# Patient Record
Sex: Female | Born: 1990 | Race: Black or African American | Hispanic: No | Marital: Single | Smoking: Never smoker
Health system: Southern US, Community
[De-identification: ages and names within clinical notes are randomized; demographics above are authoritative.]

## PROBLEM LIST (undated history)

## (undated) ENCOUNTER — Inpatient Hospital Stay (HOSPITAL_COMMUNITY): Payer: Self-pay

## (undated) HISTORY — PX: TOOTH EXTRACTION: SUR596

---
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
## 2012-11-10 IMAGING — CT CT HEAD W/O CM
4 series · 17 of 30 positions shown, 19 images · non-contrast
Comparison: None.

CLINICAL DATA: EtOH, lethargy

CT HEAD WITHOUT CONTRAST
TECHNIQUE: Contiguous axial images were obtained from the base of
the skull through the vertex without contrast.

[Series 2: head w/o · axial · non-contrast · 0.43mm/px · z∈[-145,-45]mm · 6 of 29 slices shown, 8 images (1 of 2)]
[im 5/29  brain]
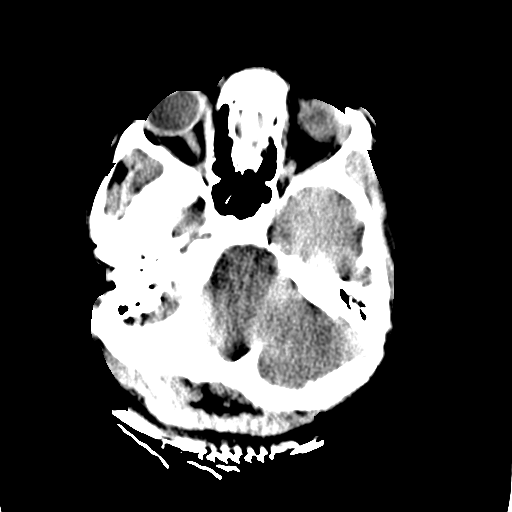
[im 5/29  bone]
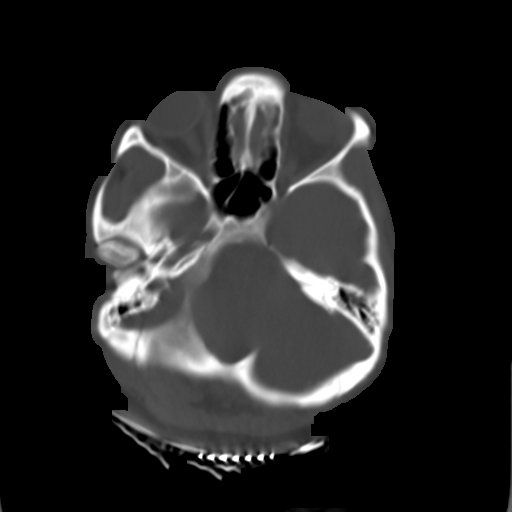
[im 9/29  brain]
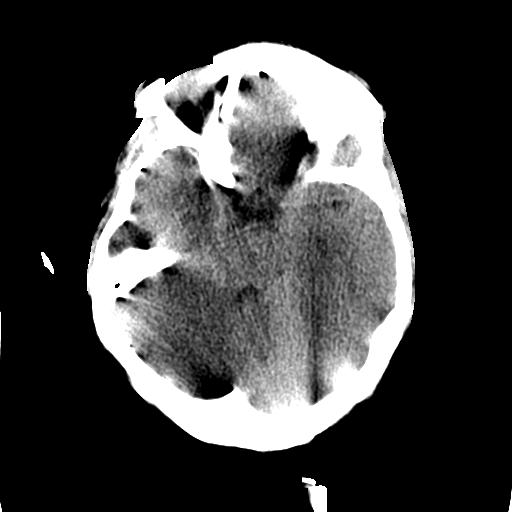
[im 13/29  brain]
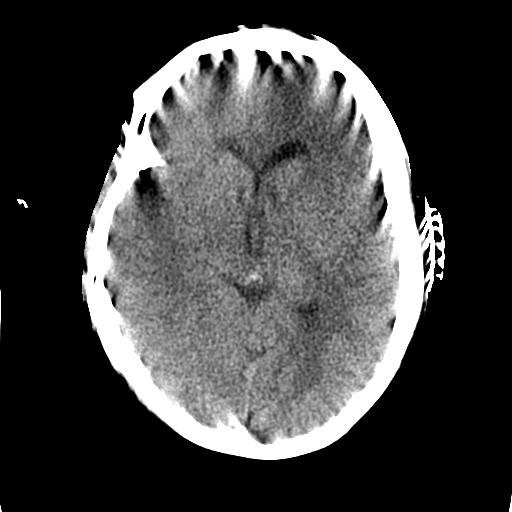
[im 17/29  brain]
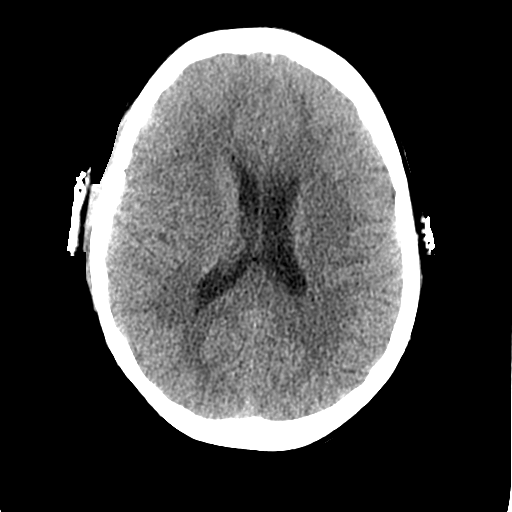
[im 21/29  brain]
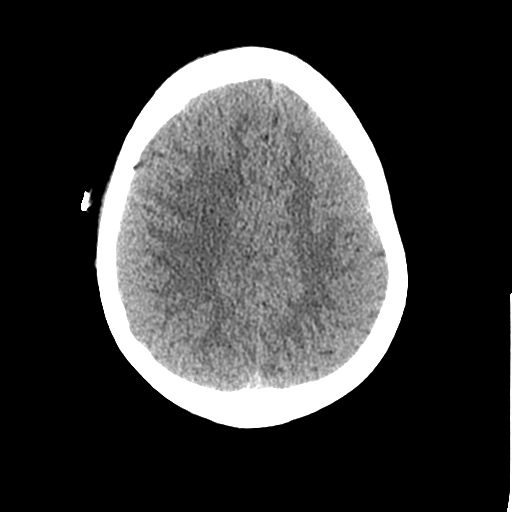
[im 21/29  bone]
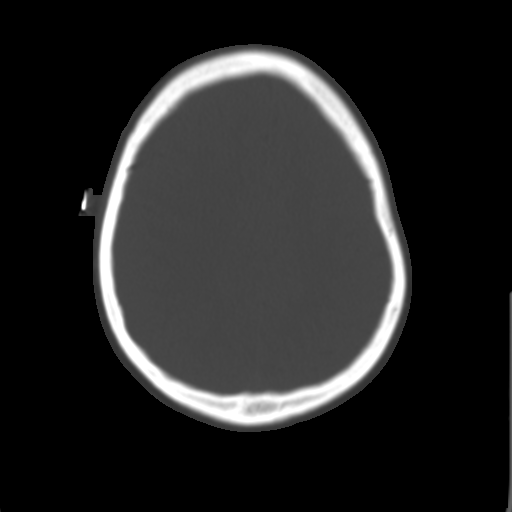
[im 25/29  brain]
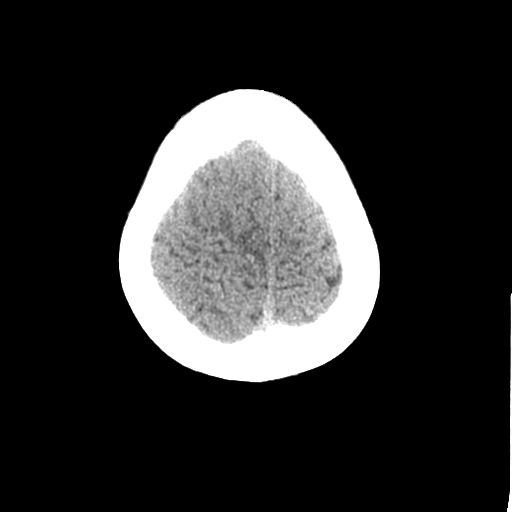

[Series 3: bone windows · axial · 0.43mm/px · z∈[-153,-39]mm · 8 of 48 slices shown (1 of 2)]
[im 5/48  bone]
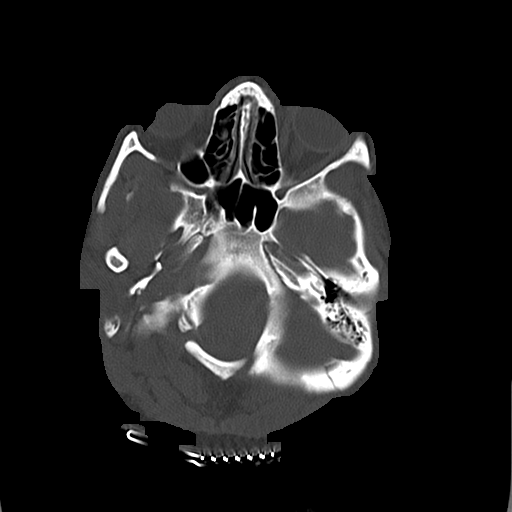
[im 9/48  bone]
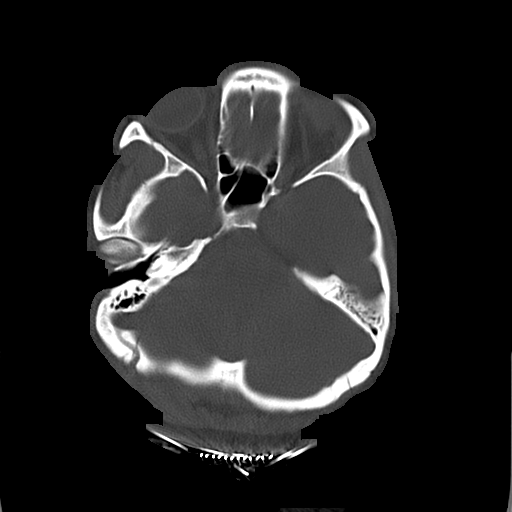
[im 18/48  bone]
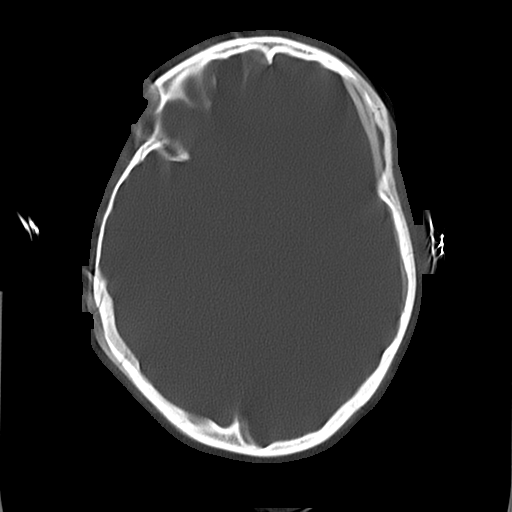
[im 22/48  bone]
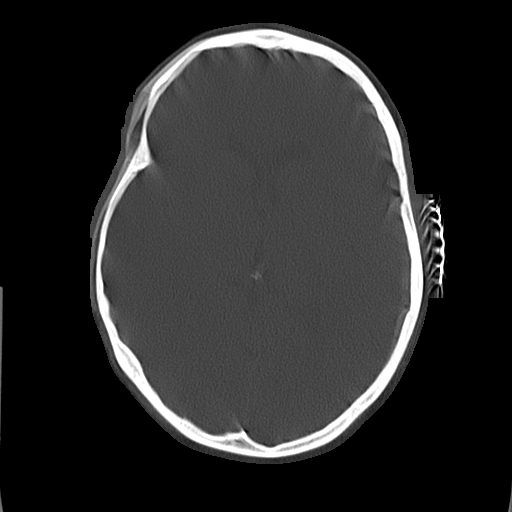
[im 26/48  bone]
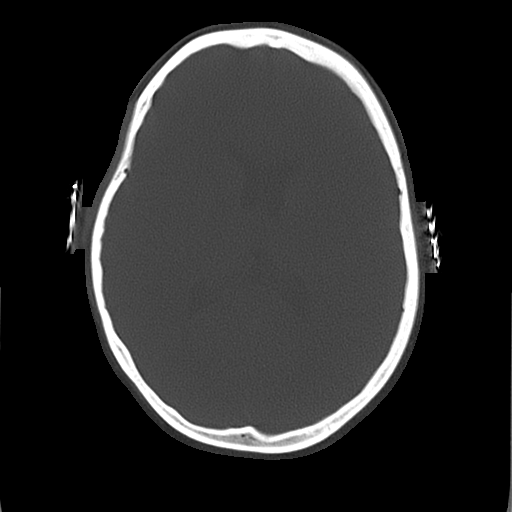
[im 30/48  bone]
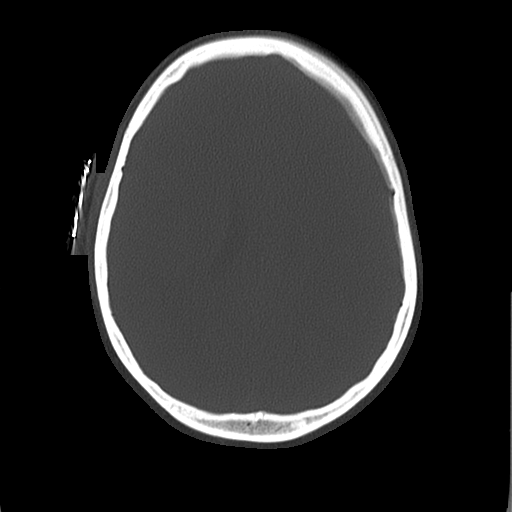
[im 39/48  bone]
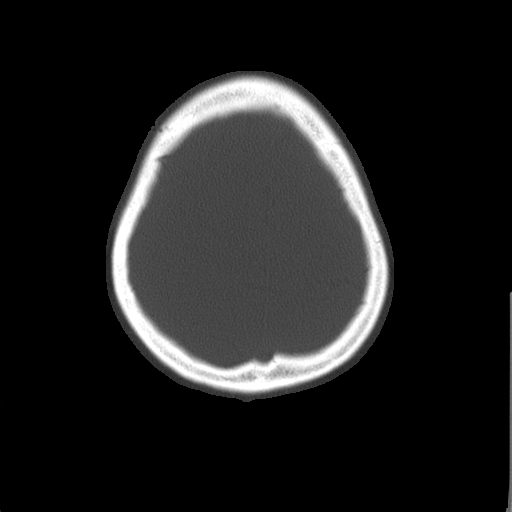
[im 43/48  bone]
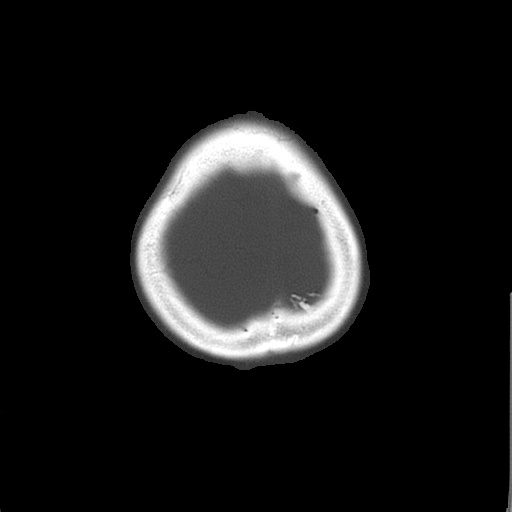

[Series 4: head w/o · axial · non-contrast · 0.43mm/px · z∈[-145,-120]mm · 2 of 15 slices shown (2 of 2)]
[im 5/15  brain]
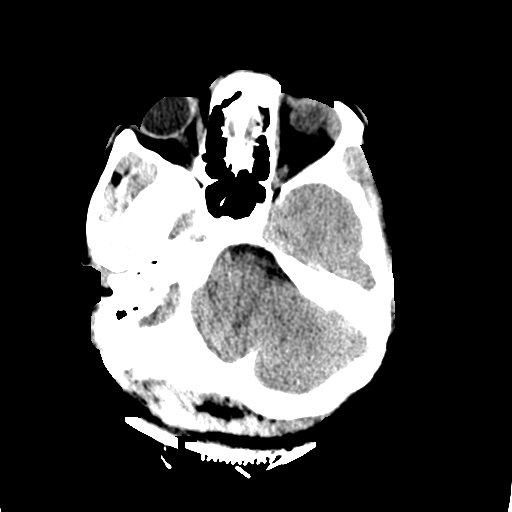
[im 10/15  brain]
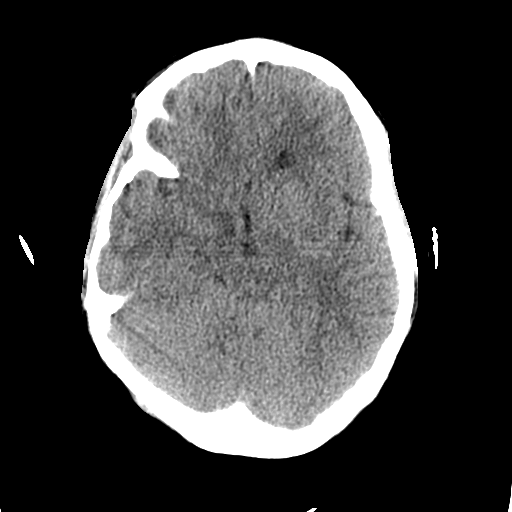

[Series 5: bone windows · axial · 0.43mm/px · 1 of 24 slices shown (2 of 2)]
[im 5/24  bone]
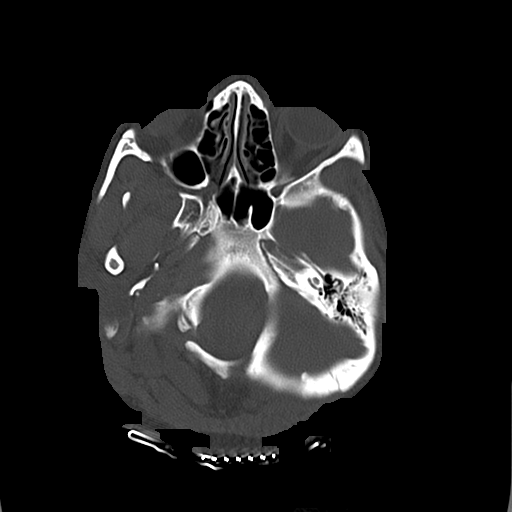

[17 of 30 positions shown; findings below may reference images not displayed]

FINDINGS: Degraded by patient motion and metallic hair clips within
the limitations, There is no evidence for large acute hemorrhage,
hydrocephalus, mass lesion, or large abnormal extra-axial fluid
collection.  No definite CT evidence for acute infarction.  The
visualized paranasal sinuses and mastoid air cells are
predominately clear.  No displaced calvarial fracture identified.
IMPRESSION: Degraded by motion and metallic hair clips.  No definite acute
process identified.  If clinical concern persists, consider
repeating when the patient can tolerate.

## 2016-12-29 ENCOUNTER — Ambulatory Visit (INDEPENDENT_AMBULATORY_CARE_PROVIDER_SITE_OTHER): Payer: No Typology Code available for payment source

## 2016-12-29 ENCOUNTER — Ambulatory Visit (HOSPITAL_COMMUNITY)
Admission: EM | Admit: 2016-12-29 | Discharge: 2016-12-29 | Disposition: A | Discharge status: Home / Self Care | Payer: No Typology Code available for payment source | Attending: Internal Medicine | Admitting: Internal Medicine

## 2016-12-29 ENCOUNTER — Encounter (HOSPITAL_COMMUNITY): Payer: Self-pay | Admitting: Emergency Medicine

## 2016-12-29 DIAGNOSIS — M791 Myalgia: Secondary | ICD-10-CM

## 2016-12-29 DIAGNOSIS — M7918 Myalgia, other site: Secondary | ICD-10-CM

## 2016-12-29 MED ORDER — IBUPROFEN 800 MG PO TABS
800.0000 mg | ORAL_TABLET | Freq: Once | ORAL | Status: AC
  Administered 2016-12-29: 800 mg via ORAL

## 2016-12-29 MED ORDER — IBUPROFEN 800 MG PO TABS
ORAL_TABLET | ORAL | Status: AC
  Filled 2016-12-29: qty 1

## 2016-12-29 MED ORDER — NAPROXEN 500 MG PO TABS: 500.0000 mg | ORAL_TABLET | Freq: Two times a day (BID) | ORAL | 0 refills | Status: DC

## 2016-12-29 NOTE — Discharge Instructions (Addendum)
Anticipate gradual improvement in back discomfort over the next week or two.  Ice for 5-10 minutes several times daily may help reduce pain.  Physical therapy and gentle stretching may also help reduce pain.  Xrays of the back in the urgent care today were negative for acute bony injury/fracture.  Prescription for naproxen (anti inflammatory, pain reliever) was given.  Recheck for further evaluation if not improving as expected.
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
## 2016-12-29 NOTE — ED Notes (Signed)
Instructed to put on gown for xray 
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
## 2017-07-25 ENCOUNTER — Inpatient Hospital Stay (HOSPITAL_COMMUNITY)
Admission: AD | Admit: 2017-07-25 | Discharge: 2017-07-25 | Disposition: A | Discharge status: Home / Self Care | Payer: BC Managed Care – PPO | Source: Ambulatory Visit | Attending: Family Medicine | Admitting: Family Medicine

## 2017-07-25 ENCOUNTER — Encounter (HOSPITAL_COMMUNITY): Payer: Self-pay

## 2017-07-25 DIAGNOSIS — O211 Hyperemesis gravidarum with metabolic disturbance: Secondary | ICD-10-CM | POA: Insufficient documentation

## 2017-07-25 DIAGNOSIS — O219 Vomiting of pregnancy, unspecified: Secondary | ICD-10-CM | POA: Diagnosis not present

## 2017-07-25 DIAGNOSIS — Z3A10 10 weeks gestation of pregnancy: Secondary | ICD-10-CM | POA: Diagnosis not present

## 2017-07-25 DIAGNOSIS — O99281 Endocrine, nutritional and metabolic diseases complicating pregnancy, first trimester: Secondary | ICD-10-CM | POA: Diagnosis not present

## 2017-07-25 DIAGNOSIS — E860 Dehydration: Secondary | ICD-10-CM | POA: Diagnosis not present

## 2017-07-25 LAB — CBC WITH DIFFERENTIAL/PLATELET
BASOS PCT: 0 %
Basophils Absolute: 0 10*3/uL (ref 0.0–0.1)
EOS ABS: 0 10*3/uL (ref 0.0–0.7)
EOS PCT: 0 %
HCT: 40.6 % (ref 36.0–46.0)
Hemoglobin: 14.4 g/dL (ref 12.0–15.0)
Lymphocytes Relative: 17 %
Lymphs Abs: 1.5 10*3/uL (ref 0.7–4.0)
MCH: 30.3 pg (ref 26.0–34.0)
MCHC: 35.5 g/dL (ref 30.0–36.0)
MCV: 85.3 fL (ref 78.0–100.0)
MONOS PCT: 4 %
Monocytes Absolute: 0.4 10*3/uL (ref 0.1–1.0)
NEUTROS PCT: 79 %
Neutro Abs: 7.1 10*3/uL (ref 1.7–7.7)
PLATELETS: 180 10*3/uL (ref 150–400)
RBC: 4.76 MIL/uL (ref 3.87–5.11)
RDW: 12.9 % (ref 11.5–15.5)
WBC: 9 10*3/uL (ref 4.0–10.5)

## 2017-07-25 LAB — URINALYSIS, ROUTINE W REFLEX MICROSCOPIC
BILIRUBIN URINE: NEGATIVE
Bacteria, UA: NONE SEEN
GLUCOSE, UA: NEGATIVE mg/dL
Hgb urine dipstick: NEGATIVE
KETONES UR: 80 mg/dL — AB
LEUKOCYTES UA: NEGATIVE
Nitrite: NEGATIVE
Protein, ur: 100 mg/dL — AB
Specific Gravity, Urine: 1.032 — ABNORMAL HIGH (ref 1.005–1.030)
pH: 6 (ref 5.0–8.0)

## 2017-07-25 LAB — COMPREHENSIVE METABOLIC PANEL
ALBUMIN: 4 g/dL (ref 3.5–5.0)
ALT: 14 U/L (ref 14–54)
ANION GAP: 10 (ref 5–15)
AST: 20 U/L (ref 15–41)
Alkaline Phosphatase: 82 U/L (ref 38–126)
BUN: 10 mg/dL (ref 6–20)
CO2: 23 mmol/L (ref 22–32)
Calcium: 9.9 mg/dL (ref 8.9–10.3)
Chloride: 102 mmol/L (ref 101–111)
Creatinine, Ser: 0.66 mg/dL (ref 0.44–1.00)
GFR calc non Af Amer: >60 mL/min (ref >60)
GLUCOSE: 88 mg/dL (ref 65–99)
POTASSIUM: 4.1 mmol/L (ref 3.5–5.1)
Sodium: 135 mmol/L (ref 135–145)
Total Bilirubin: 0.9 mg/dL (ref 0.3–1.2)
Total Protein: 7.6 g/dL (ref 6.5–8.1)

## 2017-07-25 LAB — POCT PREGNANCY, URINE: Preg Test, Ur: POSITIVE — AB

## 2017-07-25 MED ORDER — PROMETHAZINE HCL 25 MG PO TABS: 25.0000 mg | ORAL_TABLET | Freq: Four times a day (QID) | ORAL | 0 refills | Status: DC | PRN

## 2017-07-25 MED ORDER — SODIUM CHLORIDE 0.9 % IV SOLN
Freq: Once | INTRAVENOUS | Status: AC
  Administered 2017-07-25: 22:00:00 via INTRAVENOUS
  Filled 2017-07-25: qty 1000

## 2017-07-25 MED ORDER — LACTATED RINGERS IV SOLN
25.0000 mg | Freq: Once | INTRAVENOUS | Status: AC
  Administered 2017-07-25: 25 mg via INTRAVENOUS
  Filled 2017-07-25: qty 1

## 2017-07-25 NOTE — MAU Provider Note (Signed)
History     CSN: [id number]  Arrival date and time: 07/25/17 [id number]   First Provider Initiated Contact with Patient 07/25/17 1958      Chief Complaint  Patient presents with  . Nausea  . Emesis   HPI  Claudia Mccullough is a 27 y.o. G2P1001 at 854w1d who presents to MAU today with complaint of N/V for the last few days. She states she is unable to keep anything down. She denies lower abdominal pain, vaginal bleeding, diarrhea, fever or sick contacts. She was told to try vitamin B6 for nausea, but hasn't been able to find any at the pharmacy.   OB History    Gravida Para Term Preterm AB Living   2 1 1     1    SAB TAB Ectopic Multiple Live Births           1      History reviewed. No pertinent past medical history.  Past Surgical History:  Procedure Laterality Date  . TOOTH EXTRACTION      Family History  Problem Relation Age of Onset  . Hypertension Father     Social History   Tobacco Use  . Smoking status: Never Smoker  . Smokeless tobacco: Never Used  Substance Use Topics  . Alcohol use: No    Frequency: Never    Comment: binge drinking/ not currently   . Drug use: Yes    Types: Marijuana    Comment: last use 07/22/2017    Allergies: No Known Allergies  Medications Prior to Admission  Medication Sig Dispense Refill Last Dose  . naproxen (NAPROSYN) 500 MG tablet Take 1 tablet (500 mg total) by mouth 2 (two) times daily. 30 tablet 0     Review of Systems  Constitutional: Negative for fever.  Gastrointestinal: Positive for nausea and vomiting. Negative for abdominal pain, constipation and diarrhea.  Genitourinary: Negative for dysuria, frequency, urgency, vaginal bleeding and vaginal discharge.   Physical Exam   Blood pressure (!) 134/97, pulse (!) 120, temperature 98.4 F (36.9 C), temperature source Oral, resp. rate 18, height 5\' 3"  (1.6 m), weight 144 lb (65.3 kg), SpO2 99 %.  Physical Exam  Nursing note and vitals reviewed. Constitutional: She is  oriented to person, place, and time. She appears well-developed and well-nourished. No distress.  HENT:  Head: Normocephalic and atraumatic.  Cardiovascular: Normal rate.  Respiratory: Effort normal.  GI: Soft. She exhibits no distension.  Neurological: She is alert and oriented to person, place, and time.  Skin: Skin is warm and dry. No erythema.  Psychiatric: She has a normal mood and affect.    Results for orders placed or performed during the hospital encounter of 07/25/17 (from the past 24 hour(s))  Urinalysis, Routine w reflex microscopic     Status: Abnormal   Collection Time: 07/25/17  7:37 PM  Result Value Ref Range   Color, Urine YELLOW YELLOW   APPearance HAZY (A) CLEAR   Specific Gravity, Urine 1.032 (H) 1.005 - 1.030   pH 6.0 5.0 - 8.0   Glucose, UA NEGATIVE NEGATIVE mg/dL   Hgb urine dipstick NEGATIVE NEGATIVE   Bilirubin Urine NEGATIVE NEGATIVE   Ketones, ur 80 (A) NEGATIVE mg/dL   Protein, ur 119100 (A) NEGATIVE mg/dL   Nitrite NEGATIVE NEGATIVE   Leukocytes, UA NEGATIVE NEGATIVE   RBC / HPF 0-5 0 - 5 RBC/hpf   WBC, UA 0-5 0 - 5 WBC/hpf   Bacteria, UA NONE SEEN NONE SEEN   Squamous Epithelial /  LPF 0-5 (A) NONE SEEN   Mucus PRESENT   Pregnancy, urine POC     Status: Abnormal   Collection Time: 07/25/17  7:52 PM  Result Value Ref Range   Preg Test, Ur POSITIVE (A) NEGATIVE  CBC with Differential/Platelet     Status: None   Collection Time: 07/25/17  8:14 PM  Result Value Ref Range   WBC 9.0 4.0 - 10.5 K/uL   RBC 4.76 3.87 - 5.11 MIL/uL   Hemoglobin 14.4 12.0 - 15.0 g/dL   HCT 62.1 30.8 - 65.7 %   MCV 85.3 78.0 - 100.0 fL   MCH 30.3 26.0 - 34.0 pg   MCHC 35.5 30.0 - 36.0 g/dL   RDW 84.6 96.2 - 95.2 %   Platelets 180 150 - 400 K/uL   Neutrophils Relative % 79 %   Neutro Abs 7.1 1.7 - 7.7 K/uL   Lymphocytes Relative 17 %   Lymphs Abs 1.5 0.7 - 4.0 K/uL   Monocytes Relative 4 %   Monocytes Absolute 0.4 0.1 - 1.0 K/uL   Eosinophils Relative 0 %    Eosinophils Absolute 0.0 0.0 - 0.7 K/uL   Basophils Relative 0 %   Basophils Absolute 0.0 0.0 - 0.1 K/uL  Comprehensive metabolic panel     Status: None   Collection Time: 07/25/17  8:14 PM  Result Value Ref Range   Sodium 135 135 - 145 mmol/L   Potassium 4.1 3.5 - 5.1 mmol/L   Chloride 102 101 - 111 mmol/L   CO2 23 22 - 32 mmol/L   Glucose, Bld 88 65 - 99 mg/dL   BUN 10 6 - 20 mg/dL   Creatinine, Ser 8.41 0.44 - 1.00 mg/dL   Calcium 9.9 8.9 - 32.4 mg/dL   Total Protein 7.6 6.5 - 8.1 g/dL   Albumin 4.0 3.5 - 5.0 g/dL   AST 20 15 - 41 U/L   ALT 14 14 - 54 U/L   Alkaline Phosphatase 82 38 - 126 U/L   Total Bilirubin 0.9 0.3 - 1.2 mg/dL   GFR calc non Af Amer >60 >60 mL/min   GFR calc Af Amer >60 >60 mL/min   Anion gap 10 5 - 15    MAU Course  Procedures None  MDM +UPT  FHR - 169 bpm with doppler  UA today with evidence of dehydration  IV LR with phenergan infusion ordered  IV LR with MV ordered 2100 - Patient receiving IV fluids and anti-emetics. Care turned over to [facility], CNM   Vonzella Nipple, PA-C 07/25/2017, 9:00 PM   Tolerating po. No emesis. Stable for discharge.  Assessment and Plan   1. [redacted] weeks gestation of pregnancy   2. Nausea/vomiting in pregnancy   3. Dehydration    Discharge home Follow up with [facility] family med as scheduled Rx Phenergan HEG diet Maintain hydration  Allergies as of 07/25/2017   No Known Allergies     Medication List    STOP taking these medications   naproxen 500 MG tablet Commonly known as:  NAPROSYN     TAKE these medications   promethazine 25 MG tablet Commonly known as:  PHENERGAN Take 1 tablet (25 mg total) by mouth every 6 (six) hours as needed for nausea or vomiting.      Donette Larry, CNM  07/25/2017 11:16 PM

## 2017-07-25 NOTE — MAU Note (Signed)
Pt here with c/o nausea and vomiting. "Can't keep anything down." 

## 2017-07-25 NOTE — Discharge Instructions (Signed)
Eating Plan for Hyperemesis Gravidarum °Hyperemesis gravidarum is a severe form of morning sickness. Because this condition causes severe nausea and vomiting, it can lead to dehydration, malnutrition, and weight loss. One way to lessen the symptoms of nausea and vomiting is to follow the eating plan for hyperemesis gravidarum. It is often used along with prescribed medicines to control your symptoms. °What can I do to relieve my symptoms? °Listen to your body. Everyone is different and has different preferences. Find what works best for you. Take any of the following actions that are helpful to you: °· Eat and drink slowly. °· Eat 5-6 small meals daily instead of 3 large meals. °· Eat crackers before you get out of bed in the morning. °· Try having a snack in the middle of the night. °· Starchy foods are usually tolerated well. Examples include cereal, toast, bread, potatoes, pasta, rice, and pretzels. °· Ginger may help with nausea. Add ¼ tsp ground ginger to hot tea or choose ginger tea. °· Try drinking 100% fruit juice or an electrolyte drink. An electrolyte drink contains sodium, potassium, and chloride. °· Continue to take your prenatal vitamins as told by your health care provider. If you are having trouble taking your prenatal vitamins, talk with your health care provider about different options. °· Include at least 1 serving of protein with your meals and snacks. Protein options include meats or poultry, beans, nuts, eggs, and yogurt. Try eating a protein-rich snack before bed. Examples of these snacks include cheese and crackers or half of a peanut butter or turkey sandwich. °· Consider eliminating foods that trigger your symptoms. These may include spicy foods, coffee, high-fat foods, very sweet foods, and acidic foods. °· Try meals that have more protein combined with bland, salty, lower-fat, and dry foods, such as nuts, seeds, pretzels, crackers, and cereal. °· Talk with your healthcare provider about  starting a supplement of vitamin B6. °· Have fluids that are cold, clear, and carbonated or sour. Examples include lemonade, ginger ale, lemon-lime soda, ice water, and sparkling water. °· Try lemon or mint tea. °· Try brushing your teeth or using a mouth rinse after meals. ° °What should I avoid to reduce my symptoms? °Avoiding some of the following things may help reduce your symptoms. °· Foods with strong smells. Try eating meals in well-ventilated areas that are free of odors. °· Drinking water or other beverages with meals. Try not to drink anything during the 30 minutes before and after your meals. °· Drinking more than 1 cup of fluid at a time. Sometimes using a straw helps. °· Fried or high-fat foods, such as butter and cream sauces. °· Spicy foods. °· Skipping meals as best as you can. Nausea can be more intense on an empty stomach. If you cannot tolerate food at that time, do not force it. Try sucking on ice chips or other frozen items, and make up for missed calories later. °· Lying down within 2 hours after eating. °· Environmental triggers. These may include smoky rooms, closed spaces, rooms with strong smells, warm or humid places, overly loud and noisy rooms, and rooms with motion or flickering lights. °· Quick and sudden changes in your movement. ° °This information is not intended to replace advice given to you by your health care provider. Make sure you discuss any questions you have with your health care provider. °Document Released: 05/08/2007 Document Revised: 03/09/2016 Document Reviewed: 02/09/2016 °Elsevier Interactive Patient Education © 2018 [employer] ° ° °Hyperemesis Gravidarum °  Hyperemesis gravidarum is a severe form of nausea and vomiting that happens during pregnancy. Hyperemesis is worse than morning sickness. It may cause you to have nausea or vomiting all day for many days. It may keep you from eating and drinking enough food and liquids. Hyperemesis usually occurs during the  first half (the first 20 weeks) of pregnancy. It often goes away once a woman is in her second half of pregnancy. However, sometimes hyperemesis continues through an entire pregnancy. °What are the causes? °The cause of this condition is not known. It may be related to changes in chemicals (hormones) in the body during pregnancy, such as the high level of pregnancy hormone (human chorionic gonadotropin) or the increase in the female sex hormone (estrogen). °What are the signs or symptoms? °Symptoms of this condition include: °· Severe nausea and vomiting. °· Nausea that does not go away. °· Vomiting that does not allow you to keep any food down. °· Weight loss. °· Body fluid loss (dehydration). °· Having no desire to eat, or not liking food that you have previously enjoyed. ° °How is this diagnosed? °This condition may be diagnosed based on: °· A physical exam. °· Your medical history. °· Your symptoms. °· Blood tests. °· Urine tests. ° °How is this treated? °This condition may be managed with medicine. If medicines to do not help relieve nausea and vomiting, you may need to receive fluids through an IV tube at the hospital. °Follow these instructions at home: °· Take over-the-counter and prescription medicines only as told by your health care provider. °· Avoid iron pills and multivitamins that contain iron for the first 3-4 months of pregnancy. If you take prescription iron pills, do not stop taking them unless your health care provider approves. °· Take the following actions to help prevent nausea and vomiting: °? In the morning, before getting out of bed, try eating a couple of dry crackers or a piece of toast. °? Avoid foods and smells that upset your stomach. Fatty and spicy foods may make nausea worse. °? Eat 5-6 small meals a day. °? Do not drink fluids while eating meals. Drink between meals. °? Eat or suck on things that have ginger in them. Ginger can help relieve nausea. °? Avoid food preparation. The  smell of food can spoil your appetite or trigger nausea. °· Follow instructions from your health care provider about eating or drinking restrictions. °· For snacks, eat high-protein foods, such as cheese. °· Keep all follow-up and pre-birth (prenatal) visits as told by your health care provider. This is important. °Contact a health care provider if: °· You have pain in your abdomen. °· You have a severe headache. °· You have vision problems. °· You are losing weight. °Get help right away if: °· You cannot drink fluids without vomiting. °· You vomit blood. °· You have constant nausea and vomiting. °· You are very weak. °· You are very thirsty. °· You feel dizzy. °· You faint. °· You have a fever or other symptoms that last for more than 2-3 days. °· You have a fever and your symptoms suddenly get worse. °Summary °· Hyperemesis gravidarum is a severe form of nausea and vomiting that happens during pregnancy. °· Making some changes to your eating habits may help relieve nausea and vomiting. °· This condition may be managed with medicine. °· If medicines to do not help relieve nausea and vomiting, you may need to receive fluids through an IV tube at the hospital. °This information   is not intended to replace advice given to you by your health care provider. Make sure you discuss any questions you have with your health care provider. °Document Released: 07/11/2005 Document Revised: 03/09/2016 Document Reviewed: 03/09/2016 °Elsevier Interactive Patient Education © 2017 [employer]. ° °
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
## 2018-05-14 ENCOUNTER — Encounter (HOSPITAL_COMMUNITY): Payer: Self-pay

## 2020-01-06 ENCOUNTER — Ambulatory Visit
Admission: RE | Admit: 2020-01-06 | Discharge: 2020-01-06 | Disposition: A | Discharge status: Home / Self Care | Payer: 59 | Source: Ambulatory Visit | Attending: Emergency Medicine | Admitting: Emergency Medicine

## 2020-01-06 ENCOUNTER — Other Ambulatory Visit: Payer: Self-pay

## 2020-01-06 VITALS — BP 126/76 | HR 81 | Temp 98.7°F | Resp 16

## 2020-01-06 DIAGNOSIS — Z20822 Contact with and (suspected) exposure to covid-19: Secondary | ICD-10-CM | POA: Diagnosis not present

## 2020-01-06 NOTE — ED Provider Notes (Signed)
[facility]    CSN: [id number] Arrival date & time: 01/06/20  1056      History   Chief Complaint Chief Complaint  Patient presents with  . Covid Exposure    HPI Claudia Mccullough is a 29 y.o. female without significant medical history presenting for Covid testing.  States her son was sent home from daycare due to reported fever.  Patient denies symptoms at this time including fever, cough, shortness of breath.   History reviewed. No pertinent past medical history.  There are no problems to display for this patient.   Past Surgical History:  Procedure Laterality Date  . TOOTH EXTRACTION      OB History    Gravida  2   Para  1   Term  1   Preterm      AB      Living  1     SAB      TAB      Ectopic      Multiple      Live Births  1            Home Medications    Prior to Admission medications   Not on File    Family History Family History  Problem Relation Age of Onset  . Hypertension Father     Social History Social History   Tobacco Use  . Smoking status: Never Smoker  . Smokeless tobacco: Never Used  Substance Use Topics  . Alcohol use: No    Comment: binge drinking/ not currently   . Drug use: Yes    Types: Marijuana    Comment: last use 07/22/2017     Allergies   Patient has no known allergies.   Review of Systems As per HPI   Physical Exam Triage Vital Signs ED Triage Vitals [01/06/20 1112]  Enc Vitals Group     BP 126/76     Pulse Rate 81     Resp 16     Temp 98.7 F (37.1 C)     Temp src      SpO2 98 %     Weight      Height      Head Circumference      Peak Flow      Pain Score      Pain Loc      Pain Edu?      Excl. in GC?    No data found.  Updated Vital Signs BP 126/76   Pulse 81   Temp 98.7 F (37.1 C)   Resp 16   LMP  (LMP Unknown)   SpO2 98%   Visual Acuity Right Eye Distance:   Left Eye Distance:   Bilateral Distance:    Right Eye Near:   Left Eye Near:     Bilateral Near:     Physical Exam Constitutional:      General: She is not in acute distress. HENT:     Head: Normocephalic and atraumatic.  Eyes:     General: No scleral icterus.    Pupils: Pupils are equal, round, and reactive to light.  Cardiovascular:     Rate and Rhythm: Normal rate.  Pulmonary:     Effort: Pulmonary effort is normal.  Skin:    Coloration: Skin is not jaundiced or pale.  Neurological:     Mental Status: She is alert and oriented to person, place, and time.      UC Treatments / Results  Labs (  all labs ordered are listed, but only abnormal results are displayed) Labs Reviewed  NOVEL CORONAVIRUS, NAA    EKG   Radiology No results found.  Procedures Procedures (including critical care time)  Medications Ordered in UC Medications - No data to display  Initial Impression / Assessment and Plan / UC Course  I have reviewed the triage vital signs and the nursing notes.  Pertinent labs & imaging results that were available during my care of the patient were reviewed by me and considered in my medical decision making (see chart for details).     Patient afebrile, nontoxic, with SpO2 98%.  Covid PCR pending.  Patient to quarantine until results are back.  We will treat supportively as outlined below.  Return precautions discussed, patient verbalized understanding and is agreeable to plan. Final Clinical Impressions(s) / UC Diagnoses   Final diagnoses:  Contact with and (suspected) exposure to covid-19     Discharge Instructions     Your COVID test is pending - it is important to quarantine / isolate at home until your results are back. If you test positive and would like further evaluation for persistent or worsening symptoms, you may schedule an E-visit or virtual (video) visit throughout the [facility] app or website.  PLEASE NOTE: If you develop severe chest pain or shortness of breath please go to the ER or call 9-1-1 for further  evaluation --> DO NOT schedule electronic or virtual visits for this. Please call our office for further guidance / recommendations as needed.  For information about the Covid vaccine, please visit [email or web address]    ED Prescriptions    None     PDMP not reviewed this encounter.   [place], [place], [place] 01/06/20 1323

## 2020-01-06 NOTE — ED Triage Notes (Signed)
Patient requesting COVID testing after son sent home from daycare for fever

## 2020-01-06 NOTE — Discharge Instructions (Signed)
Your COVID test is pending - it is important to quarantine / isolate at home until your results are back. °If you test positive and would like further evaluation for persistent or worsening symptoms, you may schedule an E-visit or virtual (video) visit throughout the [facility] app or website. ° °PLEASE NOTE: If you develop severe chest pain or shortness of breath please go to the ER or call 9-1-1 for further evaluation --> DO NOT schedule electronic or virtual visits for this. °Please call our office for further guidance / recommendations as needed. ° °For information about the Covid vaccine, please visit [email or web address] °

## 2020-01-07 LAB — NOVEL CORONAVIRUS, NAA: SARS-CoV-2, NAA: NOT DETECTED

## 2020-01-07 LAB — SARS-COV-2, NAA 2 DAY TAT

## 2020-11-11 ENCOUNTER — Emergency Department (HOSPITAL_COMMUNITY)
Admission: EM | Admit: 2020-11-11 | Discharge: 2020-11-11 | Disposition: A | Discharge status: Home / Self Care | Payer: BC Managed Care – PPO | Attending: Emergency Medicine | Admitting: Emergency Medicine

## 2020-11-11 ENCOUNTER — Encounter (HOSPITAL_COMMUNITY): Payer: Self-pay | Admitting: Emergency Medicine

## 2020-11-11 DIAGNOSIS — H9201 Otalgia, right ear: Secondary | ICD-10-CM

## 2020-11-11 DIAGNOSIS — R519 Headache, unspecified: Secondary | ICD-10-CM | POA: Insufficient documentation

## 2020-11-11 DIAGNOSIS — K0889 Other specified disorders of teeth and supporting structures: Secondary | ICD-10-CM

## 2020-11-11 MED ORDER — OXYCODONE-ACETAMINOPHEN 5-325 MG PO TABS
1.0000 | ORAL_TABLET | Freq: Once | ORAL | Status: AC
  Administered 2020-11-11: 1 via ORAL
  Filled 2020-11-11: qty 1

## 2020-11-11 MED ORDER — ACETAMINOPHEN 500 MG PO TABS
1000.0000 mg | ORAL_TABLET | Freq: Once | ORAL | Status: AC
  Administered 2020-11-11: 1000 mg via ORAL
  Filled 2020-11-11: qty 2

## 2020-11-11 MED ORDER — PENICILLIN V POTASSIUM 500 MG PO TABS: 500.0000 mg | ORAL_TABLET | Freq: Four times a day (QID) | ORAL | 0 refills | Status: AC

## 2020-11-11 MED ORDER — LIDOCAINE VISCOUS HCL 2 % MT SOLN
15.0000 mL | Freq: Once | OROMUCOSAL | Status: AC
  Administered 2020-11-11: 15 mL via OROMUCOSAL
  Filled 2020-11-11: qty 15

## 2020-11-11 NOTE — ED Triage Notes (Addendum)
Pt here from home with c/o right ear pain that started today pt has a bad tooth to the right side upper side

## 2020-11-11 NOTE — ED Triage Notes (Signed)
Emergency Medicine Provider Triage Evaluation Note  Azayla Polo , a 30 y.o. female  was evaluated in triage.  Pt complains of right ear pain, right-sided facial pain.  Review of Systems  Positive: Facial pain, ear pain, chest pain Negative: Fever  Physical Exam  BP 138/90   Pulse 73   Temp 98.1 F (36.7 C)   Resp 18   SpO2 100%  Gen:   Awake, no distress  HEENT:  Atraumatic, right face is exquisitely tender to palpation, there is some fullness behind the right ear, ear canal is mildly erythematous but TM is clear, there is a broken tooth over the right upper posterior molars without clear abscess Resp:  Normal effort  Cardiac:  Normal rate  Abd:   Nondistended MSK:   Moves extremities without difficulty  Neuro:  Speech clear   Medical Decision Making  Medically screening exam initiated at 1:37 PM.  Appropriate orders placed.  Samreet Edenfield was informed that the remainder of the evaluation will be completed by another provider, this initial triage assessment does not replace that evaluation, and the importance of remaining in the ED until their evaluation is complete.  Clinical Impression  1.  Right ear pain 2.  Facial pain   Dartha Lodge, [place] 11/11/20 1342

## 2020-11-11 NOTE — Discharge Instructions (Signed)
You came to the emergency department today to be evaluated for your jaw and ear pain.  Your pain is likely due to an infection in your cracked tooth.  Have started you on antibiotics.  Will be important to follow-up with a dentist.  I have given you information to follow-up with a dentist in the area.  If your symptoms do not improve please follow-up with your primary care provider.  Today you received medications that may make you sleepy or impair your ability to make decisions.  For the next 24 hours please do not drive, operate heavy machinery, care for a small child with out another adult present, or perform any activities that may cause harm to you or someone else if you were to fall asleep or be impaired.   You may have diarrhea from the antibiotics.  It is very important that you continue to take the antibiotics even if you get diarrhea unless a medical professional tells you that you may stop taking them.  If you stop too early the bacteria you are being treated for will become stronger and you may need different, more powerful antibiotics that have more side effects and worsening diarrhea.  Please stay well hydrated and consider probiotics as they may decrease the severity of your diarrhea.  Please be aware that if you take any hormonal contraception (birth control pills, nexplanon, the ring, etc) that your birth control will not work while you are taking antibiotics and you need to use back up protection as directed on the birth control medication information insert.    Get help right away if: You are unable to open your mouth. You are having trouble breathing or swallowing. You have a fever. You notice that your face, neck, or jaw is swollen.

## 2020-11-11 NOTE — ED Provider Notes (Signed)
[facility] EMERGENCY DEPARTMENT Provider Note   CSN: [id number] Arrival date & time: 11/11/20  1322     History No chief complaint on file.   Claudia Mccullough is a 30 y.o. female with no pertinent past medical history.  Patient presents with chief plaint of right ER and right-sided facial pain.  She reports that her pain began earlier this morning.  Patient reports that pain was sudden in onset and has progressively worsened over time.  Pain starts in her jaw and moves to her right ear and temple.  Patient rates her pain 10/10 on the pain scale.  Patient reports pain is worse with movement or touch.  She describes her pain as a throbbing sensation.  Patient reports no relief of symptoms with 1500 mg ibuprofen or Orajel.   Patient reports history of grinding her teeth at night.  Patient reports that she cracked her top right molar a "few," months ago.  Patient denies any fevers, chills, facial swelling, visual disturbance, neck pain, headache, slurred speech, facial asymmetry, chest pain, shortness of breath.    HPI     No past medical history on file.  There are no problems to display for this patient.   Past Surgical History:  Procedure Laterality Date  . TOOTH EXTRACTION       OB History    Gravida  2   Para  1   Term  1   Preterm      AB      Living  1     SAB      IAB      Ectopic      Multiple      Live Births  1           Family History  Problem Relation Age of Onset  . Hypertension Father     Social History   Tobacco Use  . Smoking status: Never Smoker  . Smokeless tobacco: Never Used  Substance Use Topics  . Alcohol use: No    Comment: binge drinking/ not currently   . Drug use: Yes    Types: Marijuana    Comment: last use 07/22/2017    Home Medications Prior to Admission medications   Not on File    Allergies    Patient has no known allergies.  Review of Systems   Review of Systems  Constitutional:  Negative for chills and fever.  HENT: Positive for dental problem and ear pain. Negative for congestion, ear discharge, facial swelling, hearing loss, rhinorrhea, sinus pressure, sinus pain, sore throat, trouble swallowing and voice change.   Eyes: Negative for visual disturbance.  Respiratory: Negative for shortness of breath.   Cardiovascular: Negative for chest pain.  Gastrointestinal: Negative for nausea and vomiting.  Musculoskeletal: Negative for neck pain and neck stiffness.  Skin: Negative for color change and wound.  Neurological: Negative for dizziness, tremors, seizures, syncope, facial asymmetry, speech difficulty, weakness, light-headedness, numbness and headaches.  Psychiatric/Behavioral: Negative for confusion.    Physical Exam Updated Vital Signs BP 138/90   Pulse 73   Temp 98.1 F (36.7 C)   Resp 18   SpO2 100%   Physical Exam Vitals and nursing note reviewed.  Constitutional:      General: She is not in acute distress.    Appearance: She is not ill-appearing, toxic-appearing or diaphoretic.  HENT:     Head: Normocephalic and atraumatic. No raccoon eyes, abrasion, masses, right periorbital erythema, left periorbital erythema or laceration.  Jaw: Tenderness (right upper and lower) present. No trismus, swelling, pain on movement or malocclusion.     Comments: Exquisite tenderness to right sided face    Right Ear: No decreased hearing noted. No laceration, drainage, swelling or tenderness. No middle ear effusion. There is no impacted cerumen. No foreign body. No mastoid tenderness. No hemotympanum. Tympanic membrane is not injected, scarred, perforated, erythematous, retracted or bulging.     Left Ear: No decreased hearing noted. No laceration, drainage, swelling or tenderness.  No middle ear effusion. There is no impacted cerumen. No foreign body. No mastoid tenderness. No hemotympanum. Tympanic membrane is not injected, scarred, perforated, erythematous, retracted or  bulging.     Ears:     Comments: Mild erythema to right ear canal, mild effusion behind right TM    Mouth/Throat:     Mouth: Mucous membranes are moist.     Dentition: Abnormal dentition. Dental tenderness present. No dental abscesses.     Pharynx: Oropharynx is clear. Uvula midline. No pharyngeal swelling, oropharyngeal exudate, posterior oropharyngeal erythema or uvula swelling.     Tonsils: No tonsillar exudate or tonsillar abscesses.      Comments: Patient has cracked right upper back molar, dental tenderness present  No dental abscess observed  Eyes:     General: No scleral icterus.       Right eye: No discharge.        Left eye: No discharge.  Neck:     Vascular: No carotid bruit.  Cardiovascular:     Rate and Rhythm: Normal rate.     Pulses:          Carotid pulses are 3+ on the right side and 3+ on the left side. Pulmonary:     Effort: Pulmonary effort is normal.  Musculoskeletal:     Cervical back: Full passive range of motion without pain, normal range of motion and neck supple. No edema, erythema, signs of trauma, rigidity, torticollis or crepitus. No pain with movement, spinous process tenderness or muscular tenderness. Normal range of motion.  Lymphadenopathy:     Cervical: No cervical adenopathy.  Skin:    General: Skin is warm and dry.  Neurological:     General: No focal deficit present.     Mental Status: She is alert.     Comments: CN II through XII intact  To move all limbs equally without any difficulty  Psychiatric:        Behavior: Behavior is cooperative.     ED Results / Procedures / Treatments   Labs (all labs ordered are listed, but only abnormal results are displayed) Labs Reviewed - No data to display  EKG None  Radiology No results found.  Procedures Procedures   Medications Ordered in ED Medications  lidocaine (XYLOCAINE) 2 % viscous mouth solution 15 mL (has no administration in time range)  oxyCODONE-acetaminophen  (PERCOCET/ROXICET) 5-325 MG per tablet 1 tablet (has no administration in time range)  acetaminophen (TYLENOL) tablet 1,000 mg (1,000 mg Oral Given 11/11/20 1341)    ED Course  I have reviewed the triage vital signs and the nursing notes.  Pertinent labs & imaging results that were available during my care of the patient were reviewed by me and considered in my medical decision making (see chart for details).    MDM Rules/Calculators/A&P                          Alert 30 year old female no acute distress,  nontoxic appearing.  Patient appears uncomfortable due to complaints of pain.  Patient presents with chief complaint of right ear pain, and right-sided facial pain.  Patient reports that her symptoms began earlier today.  Patient reports that she grinds her teeth at night and a few months ago cracked her upper right molar.    On physical exam CN II through XII intact. Patient has cracked right upper back molar, dental tenderness present.  No dental abscess observed.  Patient has cracked right upper back molar, dental tenderness present.  Mild erythema to right ear canal, mild effusion behind right TM. Exquisite tenderness to right sided face.  Patient has full range of motion to neck.  +3 carotid pulse bilaterally.  No carotid bruits.  Low suspicion for carotid dissection as patient has no neck pain, hemodynamically stable, and symptoms are not typical for dissection. Low suspicion for trigeminal neuralgia as patient's pain is not sudden and shortness interval.  Suspect that patient's pain may be due to dental infection.  Will prescribe patient with 7-day course of penicillin.  We will give patient resources to follow-up with local dentist.  Patient given strict return precautions.  Patient expressed understanding of all instructions and is agreeable with this plan.      Final Clinical Impression(s) / ED Diagnoses Final diagnoses:  Facial pain  Right ear pain    Rx / DC Orders ED  Discharge Orders         Ordered    penicillin v potassium (VEETID) 500 MG tablet  4 times daily        11/11/20 1508           Berneice Heinrich 11/11/20 2252    Mancel Bale, MD 11/12/20 [phone]

## 2020-11-11 NOTE — ED Notes (Signed)
RN reviewed discharge instructions w/ pt. Follow up care, prescriptions, and pain management reviewed. Pt had no further questions
